# Patient Record
Sex: Female | Born: 1985 | Race: White | Hispanic: No | Marital: Single | Smoking: Never smoker
Health system: Southern US, Community
[De-identification: ages and names within clinical notes are randomized; demographics above are authoritative.]

---
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
## 2007-12-03 ENCOUNTER — Ambulatory Visit (HOSPITAL_COMMUNITY): Admission: RE | Admit: 2007-12-03 | Discharge: 2007-12-03 | Payer: Self-pay | Admitting: Internal Medicine

## 2009-10-06 ENCOUNTER — Encounter: Admission: RE | Admit: 2009-10-06 | Discharge: 2009-10-06 | Payer: Self-pay | Admitting: Obstetrics & Gynecology

## 2011-03-17 NOTE — Consult Note (Signed)
   NAME:  Misty Flores, [name]                       ACCOUNT NO.:  [id number]   MEDICAL RECORD NO.:  [id number]                   PATIENT TYPE:  EMS   LOCATION:  ED                                   FACILITY:  [facility]   PHYSICIAN:  Mila Homer. Sherlean Foot, M.D.              DATE OF BIRTH:  1986-10-26   DATE OF CONSULTATION:  10/10/2002  DATE OF DISCHARGE:                                   CONSULTATION   ADMITTING DIAGNOSES:  Right wrist pain.   HISTORY OF PRESENT ILLNESS:  The patient is an 25 year old white female who  was playing high school basketball game this evening, fell on outstretched  hand.  She was brought to the emergency department by her parents.  Chief  complaint was right wrist pain, no other complaints.  Otherwise, healthy.  No medications or allergies.   PHYSICAL EXAMINATION:  GENERAL:  She is well-developed, well-nourished, in  no distress.  VITAL SIGNS:  She is afebrile.  Vital signs are stable.  EXTREMITIES:  Right wrist has swelling at the radial carpal joint, but no  other gross deformity.  She is neurovascularly intact.  She has good pulses  and capillary refill.   LABORATORIES:  AP and lateral x-rays show an intra-articular displaced  distal radius fracture.   IMPRESSION:  Displaced right distal radius fracture, intra-articular.   TREATMENT:  I did a closed reduction with hematoma block and finger traps.  Closed reduction x-rays revealed good alignment and mild joint step off.  I  will see her back in the office to insure adequate reduction was maintained  and potentially refer her to a hand surgeon.  Given her narcotic pain  medicine and given the parents instructions on elevation and ice/elevation.                                               Mila Homer. Sherlean Foot, M.D.    SDL/MEDQ  D:  10/10/2002  T:  10/11/2002  Job:  [id number]

## 2014-09-01 ENCOUNTER — Encounter: Payer: Self-pay | Admitting: Family Medicine

## 2014-09-01 ENCOUNTER — Ambulatory Visit (INDEPENDENT_AMBULATORY_CARE_PROVIDER_SITE_OTHER): Payer: BC Managed Care – PPO | Admitting: Family Medicine

## 2014-09-01 ENCOUNTER — Other Ambulatory Visit (INDEPENDENT_AMBULATORY_CARE_PROVIDER_SITE_OTHER): Payer: BC Managed Care – PPO

## 2014-09-01 VITALS — BP 122/72 | HR 68 | Ht 68.0 in | Wt 145.0 lb

## 2014-09-01 DIAGNOSIS — M25562 Pain in left knee: Secondary | ICD-10-CM

## 2014-09-01 DIAGNOSIS — M7652 Patellar tendinitis, left knee: Secondary | ICD-10-CM

## 2014-09-01 MED ORDER — NITROGLYCERIN 0.2 MG/HR TD PT24: MEDICATED_PATCH | TRANSDERMAL | Status: DC

## 2014-09-01 NOTE — Progress Notes (Signed)
  Tawana ScaleZach Chaunda Vandergriff D.O. [facility] [address] [place], [place] [address] Phone: [phone] Subjective:     CC: left knee pain  BJY:NWGNFAOZHYHPI:Subjective Misty Flores is a 28 y.o. female coming in with complaint of left knee pain. Patient states that this seems to be more of a chronic problem has gotten worse recently. Patient states is more the anterior part of the knee. Worse with jumping a rotational component. Patient denies any radiation down the legs any numbness or weakness. States that it is starting to stop her from certain activities. Patient states it does respond to ibuprofen. Denies any nighttime awakening. Patient puts the severity of 8 out of 10.     Past medical history, social, surgical and family history all reviewed in electronic medical record.   Review of Systems: No headache, visual changes, nausea, vomiting, diarrhea, constipation, dizziness, abdominal pain, skin rash, fevers, chills, night sweats, weight loss, swollen lymph nodes, body aches, joint swelling, muscle aches, chest pain, shortness of breath, mood changes.   Objective Blood pressure 122/72, pulse 68, height 5\' 8"  (1.727 m), weight 145 lb (65.772 kg), SpO2 98 %.  General: No apparent distress alert and oriented x3 mood and affect normal, dressed appropriately.  HEENT: Pupils equal, extraocular movements intact  Respiratory: Patient's speak in full sentences and does not appear short of breath  Cardiovascular: No lower extremity edema, non tender, no erythema  Skin: Warm dry intact with no signs of infection or rash on extremities or on axial skeleton.  Abdomen: Soft nontender  Neuro: Cranial nerves II through XII are intact, neurovascularly intact in all extremities with 2+ DTRs and 2+ pulses.  Lymph: No lymphadenopathy of posterior or anterior cervical chain or axillae bilaterally.  Gait normal with good balance and coordination.  MSK:  Non tender with full range of motion and good stability and  symmetric strength and tone of shoulders, elbows, wrist, hip,and ankles bilaterally.  Knee:left Mild hypertrophy of the patellar tendon compared to the contralateral side.. Tender over the inferior aspect of the patella ROM full in flexion and extension and lower leg rotation. Ligaments with solid consistent endpoints including ACL, PCL, LCL, MCL. Negative Mcmurray's, Apley's, and Thessalonian tests. Non painful patellar compression. Patellar glide without crepitus. Patellar and quadriceps tendons unremarkable. Hamstring and quadriceps strength is normal.  Contralateral knee unremarkable  MSK US performed of: left knee This study was ordered, performed, and interpreted by Terrilee FilesZach Bryndan Bilyk D.O.  Knee: All structures visualized. Anteromedial, anterolateral, posteromedial, and posterolateral menisci unremarkable without tearing, fraying, effusion, or displacement. Patellar Tendon has partial tear at its insertion. Patient also has significant hypoechoic changes in this area as well as increasing Doppler flow. Bone spur noted No abnormality of prepatellar bursa. LCL and MCL unremarkable on long and transverse views. No abnormality of origin of medial or lateral head of the gastrocnemius.  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    Impression and Recommendations:     This case required medical decision making of moderate complexity.

## 2014-09-01 NOTE — Patient Instructions (Signed)
Very nice to meet you biggie Ice 20 minutes 2 times daily. Usually after activity and before bed. Exercises daily pennsiad twice daily if needed Nitroglycerin Protocol   Apply 1/4 nitroglycerin patch to affected area daily.  Change position of patch within the affected area every 24 hours.  You may experience a headache during the first 1-2 weeks of using the patch, these should subside.  If you experience headaches after beginning nitroglycerin patch treatment, you may take your preferred over the counter pain reliever.  Another side effect of the nitroglycerin patch is skin irritation or rash related to patch adhesive.  Please notify our office if you develop more severe headaches or rash, and stop the patch.  Tendon healing with nitroglycerin patch may require 12 to 24 weeks depending on the extent of injury.  Men should not use if taking Viagra, Cialis, or Levitra.   Do not use if you have migraines or rosacea.   No jumping or running for next 2 weeks.  Patellar strap and wear daily.  See me again in 3 weeks.

## 2014-12-25 ENCOUNTER — Ambulatory Visit (INDEPENDENT_AMBULATORY_CARE_PROVIDER_SITE_OTHER): Payer: BC Managed Care – PPO | Admitting: Family Medicine

## 2014-12-25 ENCOUNTER — Telehealth: Payer: Self-pay | Admitting: General Practice

## 2014-12-25 ENCOUNTER — Encounter: Payer: Self-pay | Admitting: Family Medicine

## 2014-12-25 VITALS — BP 120/74 | HR 68 | Ht 68.0 in | Wt 145.0 lb

## 2014-12-25 DIAGNOSIS — M7652 Patellar tendinitis, left knee: Secondary | ICD-10-CM

## 2014-12-25 MED ORDER — NITROGLYCERIN 0.2 MG/HR TD PT24: MEDICATED_PATCH | TRANSDERMAL | Status: DC

## 2014-12-25 NOTE — Telephone Encounter (Signed)
Pt was seen today. She needs to be "arrived"

## 2014-12-25 NOTE — Progress Notes (Signed)
Tawana ScaleZach Kyiesha Millward D.O. [facility] [address] [place], [place] [address] Phone: [phone] Subjective:     CC: left knee pain  BJY:NWGNFAOZHYHPI:Subjective Misty PernaJennifer S Flores is a 29 y.o. female coming in with complaint of left knee pain. Patient was diagnosed to acute on chronic patellar tendinitis. Patient was to try conservative measures and did respond well to nitroglycerin patches. Patient states as long she was on the nitroglycerin geyser patches she was doing significant better. Patient states since then the pain is come back which her stopping the medical history. Patient continues to be fairly active. Patient denies any worsening the pain just unfortunately continues to have the same dull throbbing aching pain. Can give her difficulties with activities of daily living such as going up or downstairs.     Past medical history, social, surgical and family history all reviewed in electronic medical record.   Review of Systems: No headache, visual changes, nausea, vomiting, diarrhea, constipation, dizziness, abdominal pain, skin rash, fevers, chills, night sweats, weight loss, swollen lymph nodes, body aches, joint swelling, muscle aches, chest pain, shortness of breath, mood changes.   Objective Blood pressure 120/74, pulse 68, height 5\' 8"  (1.727 m), weight 145 lb (65.772 kg), SpO2 99 %.  General: No apparent distress alert and oriented x3 mood and affect normal, dressed appropriately.  HEENT: Pupils equal, extraocular movements intact  Respiratory: Patient's speak in full sentences and does not appear short of breath  Cardiovascular: No lower extremity edema, non tender, no erythema  Skin: Warm dry intact with no signs of infection or rash on extremities or on axial skeleton.  Abdomen: Soft nontender  Neuro: Cranial nerves II through XII are intact, neurovascularly intact in all extremities with 2+ DTRs and 2+ pulses.  Lymph: No lymphadenopathy of posterior or anterior cervical chain or  axillae bilaterally.  Gait normal with good balance and coordination.  MSK:  Non tender with full range of motion and good stability and symmetric strength and tone of shoulders, elbows, wrist, hip,and ankles bilaterally.  Knee:left Mild hypertrophy of the patellar tendon compared to the contralateral side.. Tender over the inferior aspect of the patella still present ROM full in flexion and extension and lower leg rotation. Ligaments with solid consistent endpoints including ACL, PCL, LCL, MCL. Negative Mcmurray's, Apley's, and Thessalonian tests. Non painful patellar compression. Patellar glide without crepitus. Patellar and quadriceps tendons unremarkable. Hamstring and quadriceps strength is normal.  Contralateral knee unremarkable  MSK US performed of: left knee This study was ordered, performed, and interpreted by Terrilee FilesZach Cordell Guercio D.O.  Knee: All structures visualized. Anteromedial, anterolateral, posteromedial, and posterolateral menisci unremarkable without tearing, fraying, effusion, or displacement. Patellar Tendon has partial tear at its insertion with 0.5% better than previous. Significant decrease in hypoechoic changes. tellar bursa. LCL and MCL unremarkable on long and transverse views. No abnormality of origin of medial or lateral head of the gastrocnemius.  IMPRESSION: Continued patellar tendinitis with significant decrease in hypoechoic changes  Procedure: Real-time Ultrasound Guided Injection of the patellar tendon and bone spur on the inferior patella Device: GE Logiq E  Ultrasound guided injection is preferred based studies that show increased duration, increased effect, greater accuracy, decreased procedural pain, increased response rate, and decreased cost with ultrasound guided versus blind injection.  Verbal informed consent obtained.  Time-out conducted.  Noted no overlying erythema, induration, or other signs of local infection.  Skin prepped in a sterile fashion.   Local anesthesia: Topical Ethyl chloride.  With sterile technique and under  real time ultrasound guidance:  21-gauge 2 inch needle was used from a medial to lateral approach. Patient had an injection of 2 mL of 0.5% Marcaine and 0.5 mL of Kenalog 41 g/dL. Patient then had a partial amount of resistance at the bone spur and repetitive passes were made with the needle. Completed without difficulty  Pain immediately resolved suggesting accurate placement of the medication.  Advised to call if fevers/chills, erythema, induration, drainage, or persistent bleeding.  Images permanently stored and available for review in the ultrasound unit.  Impression: Technically successful ultrasound guided injection.    Impression and Recommendations:     This case required medical decision making of moderate complexity.

## 2014-12-25 NOTE — Progress Notes (Signed)
Pre visit review using our clinic review tool, if applicable. No additional management support is needed unless otherwise documented below in the visit note. 

## 2014-12-25 NOTE — Telephone Encounter (Signed)
Patient a no show. Ok to schedule again?  °

## 2014-12-25 NOTE — Patient Instructions (Signed)
Good to see you Ice in 6 hours then nightly No jumping, running, legs for 10 days Start the nitro again.  Then start exercises in handout in 10 days.  Ok to start legs in 10 days at 25% of weight of what you were doing.  Then in crease 10% a week.  Tell LT how you are doing in 10 days.

## 2014-12-25 NOTE — Assessment & Plan Note (Signed)
Discussed with patient at this time and patient understood that there was a risk and benefits to this procedure. Patient is going to decrease her activity over the course the next 10 days. We discussed icing regimen and home exercises. We discussed icing. Patient will restart the nitroglycerin and we'll do this on a longer amount of time. Patient and will come back and see me again in 3-4 weeks.

## 2017-05-30 ENCOUNTER — Encounter: Payer: Self-pay | Admitting: Family Medicine

## 2017-05-30 ENCOUNTER — Ambulatory Visit (INDEPENDENT_AMBULATORY_CARE_PROVIDER_SITE_OTHER): Payer: BC Managed Care – PPO | Admitting: Family Medicine

## 2017-05-30 ENCOUNTER — Ambulatory Visit: Payer: Self-pay

## 2017-05-30 VITALS — BP 118/68 | HR 70 | Ht 67.0 in

## 2017-05-30 DIAGNOSIS — M25512 Pain in left shoulder: Secondary | ICD-10-CM

## 2017-05-30 DIAGNOSIS — S42034A Nondisplaced fracture of lateral end of right clavicle, initial encounter for closed fracture: Secondary | ICD-10-CM | POA: Diagnosis not present

## 2017-05-30 DIAGNOSIS — M7652 Patellar tendinitis, left knee: Secondary | ICD-10-CM | POA: Diagnosis not present

## 2017-05-30 DIAGNOSIS — S42001A Fracture of unspecified part of right clavicle, initial encounter for closed fracture: Secondary | ICD-10-CM | POA: Insufficient documentation

## 2017-05-30 MED ORDER — VITAMIN D (ERGOCALCIFEROL) 1.25 MG (50000 UNIT) PO CAPS: 50000.0000 [IU] | ORAL_CAPSULE | ORAL | 0 refills | Status: DC

## 2017-05-30 MED ORDER — NITROGLYCERIN 0.2 MG/HR TD PT24: MEDICATED_PATCH | TRANSDERMAL | 1 refills | Status: AC

## 2017-05-30 NOTE — Assessment & Plan Note (Signed)
Patient does have what appears to be more of a clavicle fracture. Discussed with patient at great length. We discussed icing regimen and home exercises. We discussed which activities doing which ones to avoid. Patient will start to increase activity as tolerated. Patient will come back and see me again in 4-6 weeks. Worsening symptoms we'll consider further treatment options such as injection to stimulate possible healing.

## 2017-05-30 NOTE — Patient Instructions (Signed)
You b crazy per Uvaldo RisingLindsay  Ice 20 minutes 2 times daily. Usually after activity and before bed. Exercises 3 times a week.  Once weekly vitamin D for 12 weeks.  pennsaid pinkie amount topically 2 times daily as needed.   Nitroglycerin Protocol   Apply 1/4 nitroglycerin patch to affected area daily.  Change position of patch within the affected area every 24 hours.  You may experience a headache during the first 1-2 weeks of using the patch, these should subside.  If you experience headaches after beginning nitroglycerin patch treatment, you may take your preferred over the counter pain reliever.  Another side effect of the nitroglycerin patch is skin irritation or rash related to patch adhesive.  Please notify our office if you develop more severe headaches or rash, and stop the patch.  Tendon healing with nitroglycerin patch may require 12 to 24 weeks depending on the extent of injury.  Men should not use if taking Viagra, Cialis, or Levitra.   Do not use if you have migraines or rosacea.  Exercises 3 times a week.  See me again in 4-6 weeks Enjoy your nap!

## 2017-05-30 NOTE — Progress Notes (Signed)
Tawana ScaleZach Sahirah Rudell D.O. [facility] [address] [place], [place] [address] Phone: [phone] Subjective:    I'm seeing this patient by the request  of:    CC: Right shoulder pain, left knee pain  BJY:NWGNFAOZHYHPI:Subjective  Misty PernaJennifer S Adee is a 31 y.o. female coming in with complaint of right shoulder pain. Has been Neer's. Now hurting her more. Seems to be worse with overhead motions. 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 Finds pain seems to be more chronic recently. More anteriorly. No radiation down the arm. No numbness. Rates the severity of pain a 5 out of 10 it is worsening.  Patient is also having increasing pain of her left knee. Seems long time ago and did have more of a bursitis. Was given an injection. Patient states that this feels different. Still seems to be the anterior aspect of the knee. Denies any locking or giving out on her. States though that it is even sore with daily activities.      No past medical history on file. No past surgical history on file. Social History   Social History  . Marital status: Single    Spouse name: N/A  . Number of children: N/A  . Years of education: N/A   Social History Main Topics  . Smoking status: Never Smoker  . Smokeless tobacco: Never Used  . Alcohol use None  . Drug use: Unknown  . Sexual activity: Not Asked   Other Topics Concern  . None   Social History Narrative  . None   Not on File No family history on file.   Past medical history, social, surgical and family history all reviewed in electronic medical record.  No pertanent information unless stated regarding to the chief complaint.   Review of Systems:Review of systems updated and as accurate as of 05/30/17  No headache, visual changes, nausea, vomiting, diarrhea, constipation, dizziness, abdominal pain, skin rash, fevers, chills, night sweats, weight loss, swollen lymph nodes, body aches, joint swelling, muscle aches, chest pain, shortness  of breath, mood changes.   Objective  Blood pressure 118/68, pulse 70, height 5\' 7"  (1.702 m), SpO2 99 %. Systems examined below as of 05/30/17   General: No apparent distress alert and oriented x3 mood and affect normal, dressed appropriately.  HEENT: Pupils equal, extraocular movements intact  Respiratory: Patient's speak in full sentences and does not appear short of breath  Cardiovascular: No lower extremity edema, non tender, no erythema  Skin: Warm dry intact with no signs of infection or rash on extremities or on axial skeleton.  Abdomen: Soft nontender  Neuro: Cranial nerves II through XII are intact, neurovascularly intact in all extremities with 2+ DTRs and 2+ pulses.  Lymph: No lymphadenopathy of posterior or anterior cervical chain or axillae bilaterally.  Gait normal with good balance and coordination.  MSK:  Non tender with full range of motion and good stability and symmetric strength and tone of  elbows, wrist, hip, knee and ankles bilaterally.  Shoulder: Right Inspection reveals no abnormalities, atrophy or asymmetry. Mild tenderness over the distal clavicle ROM is full in all planes. Rotator cuff strength normal throughout. Mild impingement with positive crossover Speeds and Yergason's tests normal. No labral pathology noted with negative Obrien's, negative clunk and good stability. Normal scapular function observed. No painful arc and no drop arm sign. No apprehension sign Contralateral shoulder unremarkable  Knee: Left Normal to inspection with no erythema or effusion or obvious bony abnormalities. Pain over  the patellar tendon ROM full in flexion and extension and lower leg rotation. Ligaments with solid consistent endpoints including ACL, PCL, LCL, MCL. Negative Mcmurray's, Apley's, and Thessalonian tests. Non painful patellar compression. Patellar glide without crepitus. Patellar and quadriceps tendons unremarkable. Hamstring and quadriceps strength is  normal.  Contralateral knee unremarkable   MSK US performed of: Right shoulder This study was ordered, performed, and interpreted by Terrilee FilesZach Neilson Oehlert D.O.  Shoulder:   Supraspinatus:  Appears normal on long and transverse views, no bursal bulge seen with shoulder abduction on impingement view. Infraspinatus:  Appears normal on long and transverse views. Subscapularis:  Appears normal on long and transverse views. Teres Minor:  Appears normal on long and transverse views. AC joint:  Capsule undistended, no geyser sign. Patient is a clavicle show a nonhealing fracture noted. On the tensile sign. Glenohumeral Joint:  Appears normal without effusion. Glenoid Labrum:  Intact without visualized tears. Biceps Tendon:  Appears normal on long and transverse views, no fraying of tendon, tendon located in intertubercular groove, no subluxation with shoulder internal or external rotation. No increased power doppler signal. Impression: Nonhealing clavicle fracture   Impression and Recommendations:     This case required medical decision making of moderate complexity.      Note: This dictation was prepared with Dragon dictation along with smaller phrase technology. Any transcriptional errors that result from this process are unintentional.

## 2017-05-30 NOTE — Assessment & Plan Note (Signed)
Recurrent with worsening pain. Patient is having significant discomfort in the area. Restart the nitroglycerin and did seem to be helpful. Once weekly vitamin D for strength as well as help healing the distal clavicle fracture. Worsening symptoms consider an icing regimen. Could potentially worsening another injection. We will discuss at follow-up in 4-6 weeks

## 2017-07-23 ENCOUNTER — Telehealth: Payer: Self-pay | Admitting: Family Medicine

## 2017-07-23 ENCOUNTER — Encounter: Payer: Self-pay | Admitting: *Deleted

## 2017-07-23 NOTE — Telephone Encounter (Signed)
Pt called informing you that she left her phone at home today and if you schedule her an appointment to message her on MyChart.

## 2017-07-23 NOTE — Telephone Encounter (Signed)
Pt scheduled & made aware via mychart.

## 2017-07-26 ENCOUNTER — Ambulatory Visit (INDEPENDENT_AMBULATORY_CARE_PROVIDER_SITE_OTHER): Payer: BC Managed Care – PPO | Admitting: Family Medicine

## 2017-07-26 ENCOUNTER — Encounter: Payer: Self-pay | Admitting: Family Medicine

## 2017-07-26 DIAGNOSIS — M25511 Pain in right shoulder: Secondary | ICD-10-CM

## 2017-07-26 NOTE — Assessment & Plan Note (Signed)
10 injection today. Tolerated the procedure well. We discussed icing regimen and home exercises. We discussed which activities to do in which ones to avoid. Patient will start nitroglycerin patches which she's done in the past. Continue the once weekly vitamin D and discussed other over-the-counter medications a try to make this better. Patient does do a lot of overhead repetitive motions that likely contributed to some of the discomfort as well. Patient will try to increase activity as tolerated. Follow-up again in 4 weeks

## 2017-07-26 NOTE — Patient Instructions (Signed)
Good to see you  Watch the player Stop spiking on your players ;) Injected it today  Start 1/4 patch of nitro in the area Continue the vitamin D See me again In 4 weeks.

## 2017-07-26 NOTE — Progress Notes (Signed)
[facility] [address] [place], [place] [address] Phone: [phone] Subjective:     CC: Right shoulder pain  NUU:[id number]  Misty Flores is a 31 y.o. female coming in with complaint of right shoulder pain. Patient was found to have a distal clavicle fracture and AC pain. Patient states still having pain, still giving trouble at night and with certain movements. No radiation of pain  No weakness, but could hurt with reaching across the chest.    No past medical history on file. No past surgical history on file. Social History   Social History  . Marital status: Single    Spouse name: N/A  . Number of children: N/A  . Years of education: N/A   Social History Main Topics  . Smoking status: Never Smoker  . Smokeless tobacco: Never Used  . Alcohol use None  . Drug use: Unknown  . Sexual activity: Not Asked   Other Topics Concern  . None   Social History Narrative  . None   Not on File No family history on file.   Past medical history, social, surgical and family history all reviewed in electronic medical record.  No pertanent information unless stated regarding to the chief complaint.   Review of Systems:Review of systems updated and as accurate as of 07/26/17  No headache, visual changes, nausea, vomiting, diarrhea, constipation, dizziness, abdominal pain, skin rash, fevers, chills, night sweats, weight loss, swollen lymph nodes, body aches, joint swelling, chest pain, shortness of breath, mood changes. + muscle aches.   Objective  Blood pressure 100/70, pulse (!) 59, height  (1.702 m), weight 158 lb (71.7 kg), SpO2 99 %. Systems examined below as of 07/26/17   General: No apparent distress alert and oriented x3 mood and affect normal, dressed appropriately.  HEENT: Pupils equal, extraocular movements intact  Respiratory: Patient's speak in full sentences and does not appear short of breath  Cardiovascular: No lower  extremity edema, non tender, no erythema  Skin: Warm dry intact with no signs of infection or rash on extremities or on axial skeleton.  Abdomen: Soft nontender  Neuro: Cranial nerves II through XII are intact, neurovascularly intact in all extremities with 2+ DTRs and 2+ pulses.  Lymph: No lymphadenopathy of posterior or anterior cervical chain or axillae bilaterally.  Gait normal with good balance and coordination.  MSK:  Non tender with full range of motion and good stability and symmetric strength and tone of  elbows, wrist, hip, knee and ankles bilaterally.   Shoulder: Right Inspection reveals no abnormalities, atrophy or asymmetry. Pain over the acromial clavicular joint ROM is full in all planes. Rotator cuff strength normal throughout. Mild impingement Speeds and Yergason's tests normal. No labral pathology noted with negative Obrien's, negative clunk and good stability. Positive crossover Normal scapular function observed. No painful arc and no drop arm sign. No apprehension sign Contralateral shoulder unremarkable  After verbal consent patient was prepped with call swabs and with a 25-gauge half-inch needle was injected into the right acromioclavicular joint with 0.5 mL of 0.5% Marcaine and 0.5 mL of Kenalog 40 mg/dL no blood loss. Post injection instructions given.      Impression and Recommendations:     This case required medical decision making of moderate complexity.      Note: This dictation was prepared with Dragon dictation along with smaller phrase technology. Any transcriptional errors that result from this process are unintentional.

## 2017-10-02 ENCOUNTER — Other Ambulatory Visit: Payer: Self-pay | Admitting: *Deleted

## 2017-10-02 DIAGNOSIS — M25511 Pain in right shoulder: Secondary | ICD-10-CM

## 2017-10-31 ENCOUNTER — Ambulatory Visit
Admission: RE | Admit: 2017-10-31 | Discharge: 2017-10-31 | Disposition: A | Discharge status: Home / Self Care | Payer: BC Managed Care – PPO | Source: Ambulatory Visit | Attending: Family Medicine | Admitting: Family Medicine

## 2017-10-31 ENCOUNTER — Other Ambulatory Visit: Payer: Self-pay | Admitting: *Deleted

## 2017-10-31 DIAGNOSIS — M25511 Pain in right shoulder: Secondary | ICD-10-CM

## 2018-07-17 NOTE — Progress Notes (Signed)
[facility] Scale[facility] Smith D.[facility] [address] [place], [place] [address] Phone: [phone] Subjective:   [name], [name], am serving as a scribe for Dr. Antoine PrimasZachary Smith.    CC: Right shoulder pain  ZDG:[id number]:Subjective  Misty PernaJennifer S Flores is a 32 y.o. female coming in with complaint of right shoulder pain. Is in more pain now than before her surgery in April 2019. Pain is radiating down into the anterior deltoid. Constant pain. Has been having increasing pain with wiping down white board, hitting volleyballs. Ice and NSAIDs to alleviate her pain.     History reviewed. No pertinent past medical history. History reviewed. No pertinent surgical history. Social History   Socioeconomic History  . Marital status: Single    Spouse name: Not on file  . Number of children: Not on file  . Years of education: Not on file  . Highest education level: Not on file  Occupational History  . Not on file  Social Needs  . Financial resource strain: Not on file  . Food insecurity:    Worry: Not on file    Inability: Not on file  . Transportation needs:    Medical: Not on file    Non-medical: Not on file  Tobacco Use  . Smoking status: Never Smoker  . Smokeless tobacco: Never Used  Substance and Sexual Activity  . Alcohol use: Not on file  . Drug use: Not on file  . Sexual activity: Not on file  Lifestyle  . Physical activity:    Days per week: Not on file    Minutes per session: Not on file  . Stress: Not on file  Relationships  . Social connections:    Talks on phone: Not on file    Gets together: Not on file    Attends religious service: Not on file    Active member of club or organization: Not on file    Attends meetings of clubs or organizations: Not on file    Relationship status: Not on file  Other Topics Concern  . Not on file  Social History Narrative  . Not on file   Not on File History reviewed. No pertinent family history.   Current Outpatient Medications  (Cardiovascular):  .  nitroGLYCERIN (NITRODUR - DOSED IN MG/24 HR) 0.2 mg/hr patch, 1/4 patch daily     Current Outpatient Medications (Other):  Marland Kitchen.  Vitamin D, Ergocalciferol, (DRISDOL) 50000 units CAPS capsule, Take 1 capsule (50,000 Units total) by mouth every 7 (seven) days.   Past medical history, social, surgical and family history all reviewed in electronic medical record.  No pertanent information unless stated regarding to the chief complaint.   Review of Systems:  No headache, visual changes, nausea, vomiting, diarrhea, constipation, dizziness, abdominal pain, skin rash, fevers, chills, night sweats, weight loss, swollen lymph nodes, body aches, joint swelling, chest pain, shortness of breath, mood changes.  Positive muscle aches  Objective  Blood pressure 100/70, pulse 64, height 5\' 7"  (1.702 m), weight 157 lb (71.2 kg), SpO2 96 %.   General: No apparent distress alert and oriented x3 mood and affect normal, dressed appropriately.  HEENT: Pupils equal, extraocular movements intact  Respiratory: Patient's speak in full sentences and does not appear short of breath  Cardiovascular: No lower extremity edema, non tender, no erythema  Skin: Warm dry intact with no signs of infection or rash on extremities or on axial skeleton.  Abdomen: Soft nontender  Neuro: Cranial nerves II through XII are intact,  neurovascularly intact in all extremities with 2+ DTRs and 2+ pulses.  Lymph: No lymphadenopathy of posterior or anterior cervical chain or axillae bilaterally.  Gait normal with good balance and coordination.  MSK:  Non tender with full range of motion and good stability and symmetric strength and tone of  elbows, wrist, hip, knee and ankles bilaterally.  Shoulder: Right Inspection reveals does have hypertrophy of the acromioclavicular joint noted.. Palpation is normal with no tenderness over AC joint or bicipital groove. ROM is decreased noted.  Forward flexion of 140 degrees, mild  limited in internal range of motion to sacrum. Rotator cuff strength normal throughout. Impingement noted Speeds and Yergason's tests normal. No labral pathology noted with negative Obrien's, negative clunk and good stability. Normal scapular function observed. No painful arc and no drop arm sign. No apprehension sign Contralateral shoulder unremarkable  Procedure: Real-time Ultrasound Guided Injection of right glenohumeral joint Device: GE Logiq Q7  Ultrasound guided injection is preferred based studies that show increased duration, increased effect, greater accuracy, decreased procedural pain, increased response rate with ultrasound guided versus blind injection.  Verbal informed consent obtained.  Time-out conducted.  Noted no overlying erythema, induration, or other signs of local infection.  Skin prepped in a sterile fashion.  Local anesthesia: Topical Ethyl chloride.  With sterile technique and under real time ultrasound guidance:  Joint visualized.  23g 1  inch needle inserted posterior approach. Pictures taken for needle placement. Patient did have injection of 2 cc of 1% lidocaine, 2 cc of 0.5% Marcaine, and 1.0 cc of Kenalog 40 mg/dL. Completed without difficulty  Pain immediately resolved suggesting accurate placement of the medication.  Advised to call if fevers/chills, erythema, induration, drainage, or persistent bleeding.  Images permanently stored and available for review in the ultrasound unit.  Impression: Technically successful ultrasound guided injection.     Impression and Recommendations:     This case required medical decision making of moderate complexity. The above documentation has been reviewed and is accurate and complete Judi Saa, DO       Note: This dictation was prepared with Dragon dictation along with smaller phrase technology. Any transcriptional errors that result from this process are unintentional.

## 2018-07-18 ENCOUNTER — Encounter: Payer: Self-pay | Admitting: Family Medicine

## 2018-07-18 ENCOUNTER — Ambulatory Visit: Payer: BC Managed Care – PPO | Admitting: Family Medicine

## 2018-07-18 ENCOUNTER — Ambulatory Visit: Payer: Self-pay

## 2018-07-18 VITALS — BP 100/70 | HR 64 | Ht 67.0 in | Wt 157.0 lb

## 2018-07-18 DIAGNOSIS — G8929 Other chronic pain: Secondary | ICD-10-CM | POA: Diagnosis not present

## 2018-07-18 DIAGNOSIS — M7501 Adhesive capsulitis of right shoulder: Secondary | ICD-10-CM | POA: Diagnosis not present

## 2018-07-18 DIAGNOSIS — M7500 Adhesive capsulitis of unspecified shoulder: Secondary | ICD-10-CM | POA: Insufficient documentation

## 2018-07-18 DIAGNOSIS — M25511 Pain in right shoulder: Secondary | ICD-10-CM

## 2018-07-18 NOTE — Assessment & Plan Note (Signed)
New problem.  Patient did have surgical intervention for the impingement with the acromial clavicular.  Does have some hypertrophy but does not seem to be causing the difficulty.  Given an injection today and had some improvement after some adhesions did seem to pop.  Discussed icing regimen and home exercises.  Discussed which activities to do which wants to avoid.  Discussed topical anti-inflammatories and vitamin D supplementation.  Following up again in 4 to 6 weeks

## 2019-08-26 ENCOUNTER — Other Ambulatory Visit: Payer: Self-pay

## 2019-08-26 DIAGNOSIS — Z20822 Contact with and (suspected) exposure to covid-19: Secondary | ICD-10-CM

## 2019-08-28 LAB — NOVEL CORONAVIRUS, NAA: SARS-CoV-2, NAA: NOT DETECTED

## 2019-10-27 ENCOUNTER — Ambulatory Visit: Payer: BC Managed Care – PPO | Attending: Internal Medicine

## 2019-10-27 DIAGNOSIS — Z20822 Contact with and (suspected) exposure to covid-19: Secondary | ICD-10-CM

## 2019-10-29 LAB — NOVEL CORONAVIRUS, NAA: SARS-CoV-2, NAA: DETECTED — AB

## 2020-01-08 ENCOUNTER — Ambulatory Visit: Payer: BC Managed Care – PPO | Attending: Internal Medicine

## 2020-01-08 DIAGNOSIS — Z23 Encounter for immunization: Secondary | ICD-10-CM

## 2020-01-08 NOTE — Progress Notes (Signed)
   Covid-19 Vaccination Clinic  Name:  Misty Flores    MRN: [id number] DOB: 05-16-1986  01/08/2020  Misty Flores was observed post Covid-19 immunization for 15 minutes without incident. She was provided with Vaccine Information Sheet and instruction to access the V-Safe system.   Misty Flores was instructed to call 911 with any severe reactions post vaccine: Marland Kitchen Difficulty breathing  . Swelling of face and throat  . A fast heartbeat  . A bad rash all over body  . Dizziness and weakness   Immunizations Administered    Name Date Dose VIS Date Route   Pfizer COVID-19 Vaccine 01/08/2020  4:02 PM 0.3 mL 10/10/2019 Intramuscular   Manufacturer: [employer], [employer]   Lot: [id number]   NDC: [address]-4

## 2020-02-03 ENCOUNTER — Ambulatory Visit: Payer: BC Managed Care – PPO | Attending: Internal Medicine

## 2020-02-03 DIAGNOSIS — Z23 Encounter for immunization: Secondary | ICD-10-CM

## 2020-02-03 NOTE — Progress Notes (Signed)
   Covid-19 Vaccination Clinic  Name:  LARENA OHNEMUS    MRN: [id number] DOB: Dec 08, 1985  02/03/2020  Ms. Delaluz was observed post Covid-19 immunization for 15 minutes without incident. She was provided with Vaccine Information Sheet and instruction to access the V-Safe system.   Ms. Kadar was instructed to call 911 with any severe reactions post vaccine: Marland Kitchen Difficulty breathing  . Swelling of face and throat  . A fast heartbeat  . A bad rash all over body  . Dizziness and weakness   Immunizations Administered    Name Date Dose VIS Date Route   Pfizer COVID-19 Vaccine 02/03/2020  3:56 PM 0.3 mL 10/10/2019 Intramuscular   Manufacturer: [employer], [employer]   Lot: [id number]   NDC: [address]-5

## 2021-06-23 ENCOUNTER — Ambulatory Visit (INDEPENDENT_AMBULATORY_CARE_PROVIDER_SITE_OTHER): Payer: BC Managed Care – PPO | Admitting: Otolaryngology

## 2022-01-17 ENCOUNTER — Ambulatory Visit: Payer: BC Managed Care – PPO | Admitting: Family Medicine

## 2022-01-17 ENCOUNTER — Encounter: Payer: Self-pay | Admitting: Family Medicine

## 2022-01-17 ENCOUNTER — Other Ambulatory Visit: Payer: Self-pay

## 2022-01-17 ENCOUNTER — Ambulatory Visit: Payer: Self-pay

## 2022-01-17 ENCOUNTER — Ambulatory Visit (INDEPENDENT_AMBULATORY_CARE_PROVIDER_SITE_OTHER): Payer: BC Managed Care – PPO

## 2022-01-17 ENCOUNTER — Other Ambulatory Visit: Payer: Self-pay | Admitting: Family Medicine

## 2022-01-17 VITALS — BP 116/84 | HR 77 | Ht 67.0 in | Wt 148.0 lb

## 2022-01-17 DIAGNOSIS — M25562 Pain in left knee: Secondary | ICD-10-CM | POA: Diagnosis not present

## 2022-01-17 DIAGNOSIS — M25462 Effusion, left knee: Secondary | ICD-10-CM | POA: Diagnosis not present

## 2022-01-17 MED ORDER — MELOXICAM 15 MG PO TABS: 15.0000 mg | ORAL_TABLET | Freq: Every day | ORAL | 0 refills | Status: DC

## 2022-01-17 NOTE — Progress Notes (Signed)
?Terrilee Files D.O. ?[facility] ?[address] [place] [address] ?Phone: [phone] ?Subjective:   ?I, [name], am serving as a scribe for Dr. Antoine Primas. ? ?This visit occurred during the SARS-CoV-2 public health emergency.  Safety protocols were in place, including screening questions prior to the visit, additional usage of staff PPE, and extensive cleaning of exam room while observing appropriate contact time as indicated for disinfecting solutions.  ? ?I'm seeing this patient by the request  of:  Patient, No Pcp Per (Inactive) ? ?CC: Left knee pain ? ?[id number]  ?Misty Flores is a 36 y.o. female coming in with complaint of left knee pain for past week. Seen for patellar tendonitis in 2018. Patient states that she has had pain over lateral aspect of knee. Pain worse in flexion. Pain is constant. Patient has been spinning a lot since Christmas.  ?  ? ?No past medical history on file. ?No past surgical history on file. ?Social History  ? ?Socioeconomic History  ? Marital status: Single  ?  Spouse name: Not on file  ? Number of children: Not on file  ? Years of education: Not on file  ? Highest education level: Not on file  ?Occupational History  ? Not on file  ?Tobacco Use  ? Smoking status: Never  ? Smokeless tobacco: Never  ?Substance and Sexual Activity  ? Alcohol use: Not on file  ? Drug use: Not on file  ? Sexual activity: Not on file  ?Other Topics Concern  ? Not on file  ?Social History Narrative  ? Not on file  ? ?Social Determinants of Health  ? ?Financial Resource Strain: Not on file  ?Food Insecurity: Not on file  ?Transportation Needs: Not on file  ?Physical Activity: Not on file  ?Stress: Not on file  ?Social Connections: Not on file  ? ?Not on File ?No family history on file. ? ? ?Current Outpatient Medications (Cardiovascular):  ?  nitroGLYCERIN (NITRODUR - DOSED IN MG/24 HR) 0.2 mg/hr patch, 1/4 patch daily ? ? ?Current Outpatient Medications (Analgesics):  ?   meloxicam (MOBIC) 15 MG tablet, Take 1 tablet (15 mg total) by mouth daily. ? ? ?Current Outpatient Medications (Other):  ?  Vitamin D, Ergocalciferol, (DRISDOL) 50000 units CAPS capsule, Take 1 capsule (50,000 Units total) by mouth every 7 (seven) days. ? ? ?Reviewed prior external information including notes and imaging from  ?primary care provider ?As well as notes that were available from care everywhere and other healthcare systems. ? ?Past medical history, social, surgical and family history all reviewed in electronic medical record.  No pertanent information unless stated regarding to the chief complaint.  ? ?Review of Systems: ? No headache, visual changes, nausea, vomiting, diarrhea, constipation, dizziness, abdominal pain, skin rash, fevers, chills, night sweats, weight loss, swollen lymph nodes, body aches, joint swelling, chest pain, shortness of breath, mood changes. POSITIVE muscle aches ? ?Objective  ?Blood pressure 116/84, pulse 77, height 5\' 7"  (1.702 m), weight 148 lb (67.1 kg), last menstrual period 12/26/2021, SpO2 99 %. ?  ?General: No apparent distress alert and oriented x3 mood and affect normal, dressed appropriately.  ?HEENT: Pupils equal, extraocular movements intact  ?Respiratory: Patient's speak in full sentences and does not appear short of breath  ?Cardiovascular: No lower extremity edema, non tender, no erythema  ?Gait normal with good balance and coordination.  ?MSK:   ?Left knee exam shows patient does have a trace effusion noted.  Lacks the last 5  degrees of flexion.  Patient does have mild instability of the patella noted.  Patient is tender to palpation more over the lateral joint line and the medial ? ?Limited muscular skeletal ultrasound was performed and interpreted by Antoine Primas, M  ?Limited ultrasound of patient's knee does have an effusion noted.  Patient does also have what appears to be a degenerative meniscus tear noted that is minorly protruding. ? ?Impression: Knee  effusion with chronic Meniscal injury ? ?  ?Impression and Recommendations:  ?  ? ?The above documentation has been reviewed and is accurate and complete Judi Saa, DO ? ? ? ?

## 2022-01-17 NOTE — Patient Instructions (Signed)
Tru-pull lite on [employer] ?Meloxicam 15mg  daily for 10 days ?Change seat on bike  ?Xray L knee on way out ?See me again in 5-6 weeks ?

## 2022-01-17 NOTE — Assessment & Plan Note (Signed)
Patient does have an effusion of the left knee.  Does have some mild narrowing of the lateral compartment.  Patient also does have a little more of a degenerative lateral meniscal tear noted.  Patient may also have unfortunately lateral tracking secondary to the bite.  We discussed meloxicam, icing regimen, which activities to do which wants to avoid.  Shoe pro lite brace given as well.  Follow-up again in 4 to 6 weeks worsening pain consider formal physical therapy or injection/aspiration ?

## 2022-02-21 NOTE — Progress Notes (Signed)
?Misty Flores D.O. ?[facility] ?[address] [place] [address] ?Phone: [phone] ?Subjective:   ?I, Misty Flores, am serving as a scribe for Dr. Antoine Flores. ? ?This visit occurred during the SARS-CoV-2 public health emergency.  Safety protocols were in place, including screening questions prior to the visit, additional usage of staff PPE, and extensive cleaning of exam room while observing appropriate contact time as indicated for disinfecting solutions.  ? ? ?I'm seeing this patient by the request  of:  Patient, No Pcp Per (Inactive) ? ?CC: left knee pain  ? ?[id number]  ?01/17/2022 ?Patient does have an effusion of the left knee.  Does have some mild narrowing of the lateral compartment.  Patient also does have a little more of a degenerative lateral meniscal tear noted.  Patient may also have unfortunately lateral tracking secondary to the bite.  We discussed meloxicam, icing regimen, which activities to do which wants to avoid.  Shoe pro lite brace given as well.  Follow-up again in 4 to 6 weeks worsening pain consider formal physical therapy or injection/aspiration ? ?Updated 02/22/2022 ?Misty Flores is a 36 y.o. female coming in with complaint of L knee pain. Patient states that with every step she is feeling pain in lateral aspect. Has popping, clicking and grinding. Patient denies having any weakness. ? ? ? ?  ? ?No past medical history on file. ?No past surgical history on file. ?Social History  ? ?Socioeconomic History  ? Marital status: Single  ?  Spouse name: Not on file  ? Number of children: Not on file  ? Years of education: Not on file  ? Highest education level: Not on file  ?Occupational History  ? Not on file  ?Tobacco Use  ? Smoking status: Never  ? Smokeless tobacco: Never  ?Substance and Sexual Activity  ? Alcohol use: Not on file  ? Drug use: Not on file  ? Sexual activity: Not on file  ?Other Topics Concern  ? Not on file  ?Social History Narrative  ? Not on  file  ? ?Social Determinants of Health  ? ?Financial Resource Strain: Not on file  ?Food Insecurity: Not on file  ?Transportation Needs: Not on file  ?Physical Activity: Not on file  ?Stress: Not on file  ?Social Connections: Not on file  ? ?Not on File ?No family history on file. ? ? ?Current Outpatient Medications (Cardiovascular):  ?  nitroGLYCERIN (NITRODUR - DOSED IN MG/24 HR) 0.2 mg/hr patch, 1/4 patch daily ? ? ?Current Outpatient Medications (Analgesics):  ?  meloxicam (MOBIC) 15 MG tablet, TAKE 1 TABLET(15 MG) BY MOUTH DAILY ? ? ?Current Outpatient Medications (Other):  ?  Vitamin D, Ergocalciferol, (DRISDOL) 50000 units CAPS capsule, Take 1 capsule (50,000 Units total) by mouth every 7 (seven) days. ? ? ?Reviewed prior external information including notes and imaging from  ?primary care provider ?As well as notes that were available from care everywhere and other healthcare systems. ? ?Past medical history, social, surgical and family history all reviewed in electronic medical record.  No pertanent information unless stated regarding to the chief complaint.  ? ?Review of Systems: ? No headache, visual changes, nausea, vomiting, diarrhea, constipation, dizziness, abdominal pain, skin rash, fevers, chills, night sweats, weight loss, swollen lymph nodes, body aches, joint swelling, chest pain, shortness of breath, mood changes. POSITIVE muscle aches ? ?Objective  ?Blood pressure 102/76, pulse 76, height 5\' 7"  (1.702 m), weight 168 lb (76.2 kg), SpO2 99 %. ?  ?  General: No apparent distress alert and oriented x3 mood and affect normal, dressed appropriately.  ?HEENT: Pupils equal, extraocular movements intact  ?Respiratory: Patient's speak in full sentences and does not appear short of breath  ?Cardiovascular: No lower extremity edema, non tender, no erythema  ?Gait normal with good balance and coordination.  ?MSK:  left knee is effusion noted, TTP medial and lateral instability with thessaly  ? ?Limited  muscular skeletal ultrasound was performed and interpreted by Misty Flores, M  ?Shows increase in hypoechoic changes noted. More in the PFJ large effusion.  ? Effusion noted on the medial aspect aspect around the medial meniscus.  Patient does have some displacement noted.  Possible parameniscal cyst also noted. ? ? ? ?  ?Impression and Recommendations:  ?  ? ?The above documentation has been reviewed and is accurate and complete Misty Saa, DO ? ? ? ?

## 2022-02-22 ENCOUNTER — Ambulatory Visit: Payer: Self-pay

## 2022-02-22 ENCOUNTER — Ambulatory Visit (INDEPENDENT_AMBULATORY_CARE_PROVIDER_SITE_OTHER): Payer: BC Managed Care – PPO | Admitting: Family Medicine

## 2022-02-22 VITALS — BP 102/76 | HR 76 | Ht 67.0 in | Wt 168.0 lb

## 2022-02-22 DIAGNOSIS — M25562 Pain in left knee: Secondary | ICD-10-CM | POA: Diagnosis not present

## 2022-02-22 NOTE — Patient Instructions (Signed)
Misty Flores it! ?Lets get MRI and see what is making it worse.  ?Call [phone] to schedule.  ?I will write you and decide if I can drain it or what else we would need to do.  ?Lillia Abed is a bad influence ?

## 2022-02-25 ENCOUNTER — Ambulatory Visit
Admission: RE | Admit: 2022-02-25 | Discharge: 2022-02-25 | Disposition: A | Discharge status: Home / Self Care | Payer: BC Managed Care – PPO | Source: Ambulatory Visit | Attending: Family Medicine | Admitting: Family Medicine

## 2022-02-25 DIAGNOSIS — M25562 Pain in left knee: Secondary | ICD-10-CM

## 2022-02-28 ENCOUNTER — Other Ambulatory Visit: Payer: Self-pay

## 2022-02-28 DIAGNOSIS — M25462 Effusion, left knee: Secondary | ICD-10-CM

## 2022-02-28 DIAGNOSIS — M25562 Pain in left knee: Secondary | ICD-10-CM

## 2022-03-04 ENCOUNTER — Other Ambulatory Visit: Payer: BC Managed Care – PPO

## 2023-02-05 ENCOUNTER — Other Ambulatory Visit: Payer: Self-pay | Admitting: Family Medicine

## 2023-02-22 ENCOUNTER — Ambulatory Visit: Payer: BC Managed Care – PPO | Admitting: Family Medicine

## 2023-02-22 ENCOUNTER — Ambulatory Visit (INDEPENDENT_AMBULATORY_CARE_PROVIDER_SITE_OTHER): Payer: Worker's Compensation

## 2023-02-22 ENCOUNTER — Encounter: Payer: Self-pay | Admitting: Family Medicine

## 2023-02-22 ENCOUNTER — Other Ambulatory Visit: Payer: Self-pay

## 2023-02-22 VITALS — BP 118/84 | HR 75 | Ht 67.0 in | Wt 159.0 lb

## 2023-02-22 DIAGNOSIS — M25531 Pain in right wrist: Secondary | ICD-10-CM

## 2023-02-22 DIAGNOSIS — S62024A Nondisplaced fracture of middle third of navicular [scaphoid] bone of right wrist, initial encounter for closed fracture: Secondary | ICD-10-CM

## 2023-02-22 DIAGNOSIS — S62009A Unspecified fracture of navicular [scaphoid] bone of unspecified wrist, initial encounter for closed fracture: Secondary | ICD-10-CM | POA: Insufficient documentation

## 2023-02-22 MED ORDER — VITAMIN D (ERGOCALCIFEROL) 1.25 MG (50000 UNIT) PO CAPS: 50000.0000 [IU] | ORAL_CAPSULE | ORAL | 0 refills | Status: AC

## 2023-02-22 NOTE — Progress Notes (Signed)
[facility] [address] [place] [address] Phone: [phone] Subjective:    I'm seeing this patient by the request  of:  Patient, No Pcp Per  CC: Right wrist pain  [id number]  Misty Flores is a 37 y.o. female coming in with complaint of R wrist pain. Patient states that she was playing volleyball 2 weeks ago and has had pain over [facility] joint and wrist. Ball hit wrist . Broke radiu in high school. Pain can be sharp at times but dull most of the time.       No past medical history on file. No past surgical history on file. Social History   Socioeconomic History   Marital status: Single    Spouse name: Not on file   Number of children: Not on file   Years of education: Not on file   Highest education level: Not on file  Occupational History   Not on file  Tobacco Use   Smoking status: Never   Smokeless tobacco: Never  Substance and Sexual Activity   Alcohol use: Not on file   Drug use: Not on file   Sexual activity: Not on file  Other Topics Concern   Not on file  Social History Narrative   Not on file   Social Determinants of Health   Financial Resource Strain: Not on file  Food Insecurity: Not on file  Transportation Needs: Not on file  Physical Activity: Not on file  Stress: Not on file  Social Connections: Not on file   Not on File No family history on file.   Current Outpatient Medications (Cardiovascular):    nitroGLYCERIN (NITRODUR - DOSED IN MG/24 HR) 0.2 mg/hr patch, 1/4 patch daily   Current Outpatient Medications (Analgesics):    meloxicam (MOBIC) 15 MG tablet, TAKE 1 TABLET(15 MG) BY MOUTH DAILY   Current Outpatient Medications (Other):    Vitamin D, Ergocalciferol, (DRISDOL) 1.25 MG (50000 UNIT) CAPS capsule, Take 1 capsule (50,000 Units total) by mouth every 7 (seven) days.   Reviewed prior external information including notes and imaging from  primary care provider As well as notes that were  available from care everywhere and other healthcare systems.  Past medical history, social, surgical and family history all reviewed in electronic medical record.  No pertanent information unless stated regarding to the chief complaint.   Review of Systems:  No headache, visual changes, nausea, vomiting, diarrhea, constipation, dizziness, abdominal pain, skin rash, fevers, chills, night sweats, weight loss, swollen lymph nodes, body aches, joint swelling, chest pain, shortness of breath, mood changes. POSITIVE muscle aches  Objective  Blood pressure 118/84, pulse 75, height  (1.702 m), weight 159 lb (72.1 kg), SpO2 99 %.   General: No apparent distress alert and oriented x3 mood and affect normal, dressed appropriately.  HEENT: Pupils equal, extraocular movements intact  Respiratory: Patient's speak in full sentences and does not appear short of breath  Cardiovascular: No lower extremity edema, non tender, no erythema  Right wrist exam severe pain with ulnar and radial deviation.  Worsening pain with palpation on the radial aspect.  Severe tenderness in the anatomical snuffbox.  Limited muscular skeletal ultrasound was performed and interpreted by Antoine Primas, M  Limited ultrasound shows the patient does have a cortical irregularity noted of the mid substance of the scaphoid bone with increasing neovascularization in the area.  Patient also has what appears to be a possible very small irregularity of the distal radius. Impression:  Scaphoid fracture    Impression and Recommendations:    The above documentation has been reviewed and is accurate and complete Judi Saa, DO

## 2023-02-22 NOTE — Patient Instructions (Addendum)
Scaphoid fx Xray today Thumb spica splint Once weekly Vit D2 K2 for a month See me 3 weeks

## 2023-02-22 NOTE — Assessment & Plan Note (Signed)
Patient does have a fracture noted.  Seems to be more of the mid portion of the scaphoid bone.  Does have some mild healing noted on ultrasound as well as on x-ray but still very minimal.  Questionable styloid irregularity also noted on x-ray.  Patient put in an EXOS brace and hopefully that this will be beneficial.  Will follow-up again in 3 weeks

## 2023-03-14 NOTE — Progress Notes (Unsigned)
[facility] [address] [place] [address] Phone: [phone] Subjective:   Misty Flores, am serving as a scribe for Dr. Antoine Primas.  I'm seeing this patient by the request  of:  Patient, No Pcp Per  CC: Right wrist pain follow-up  [id number]  02/22/2023 Patient does have a fracture noted.  Seems to be more of the mid portion of the scaphoid bone.  Does have some mild healing noted on ultrasound as well as on x-ray but still very minimal.  Questionable styloid irregularity also noted on x-ray.  Patient put in an EXOS brace and hopefully that this will be beneficial.  Will follow-up again in 3 weeks   Updated 03/15/2023 Misty Flores is a 37 y.o. female coming in with complaint of R wrist pain. Patient states that her pain is the same as last visit.  Patient states that has not noted any difference from being in the brace.  Continues to have a significant amount of pain.    No past medical history on file. No past surgical history on file. Social History   Socioeconomic History   Marital status: Single    Spouse name: Not on file   Number of children: Not on file   Years of education: Not on file   Highest education level: Not on file  Occupational History   Not on file  Tobacco Use   Smoking status: Never   Smokeless tobacco: Never  Substance and Sexual Activity   Alcohol use: Not on file   Drug use: Not on file   Sexual activity: Not on file  Other Topics Concern   Not on file  Social History Narrative   Not on file   Social Determinants of Health   Financial Resource Strain: Not on file  Food Insecurity: Not on file  Transportation Needs: Not on file  Physical Activity: Not on file  Stress: Not on file  Social Connections: Not on file   Not on File No family history on file.   Current Outpatient Medications (Cardiovascular):    nitroGLYCERIN (NITRODUR - DOSED IN MG/24 HR) 0.2 mg/hr patch, 1/4 patch  daily   Current Outpatient Medications (Analgesics):    meloxicam (MOBIC) 15 MG tablet, TAKE 1 TABLET(15 MG) BY MOUTH DAILY   Current Outpatient Medications (Other):    Vitamin D, Ergocalciferol, (DRISDOL) 1.25 MG (50000 UNIT) CAPS capsule, Take 1 capsule (50,000 Units total) by mouth every 7 (seven) days.   Reviewed prior external information including notes and imaging from  primary care provider As well as notes that were available from care everywhere and other healthcare systems.  Past medical history, social, surgical and family history all reviewed in electronic medical record.  No pertanent information unless stated regarding to the chief complaint.   Review of Systems:  No headache, visual changes, nausea, vomiting, diarrhea, constipation, dizziness, abdominal pain, skin rash, fevers, chills, night sweats, weight loss, swollen lymph nodes, body aches, joint swelling, chest pain, shortness of breath, mood changes. POSITIVE muscle aches  Objective  Blood pressure 118/82, pulse 82, height 5\' 7"  (1.702 m), weight 166 lb (75.3 kg), last menstrual period 02/11/2023, SpO2 98 %.   General: No apparent distress alert and oriented x3 mood and affect normal, dressed appropriately.  HEENT: Pupils equal, extraocular movements intact  Respiratory: Patient's speak in full sentences and does not appear short of breath  Cardiovascular: No lower extremity edema, non tender, no erythema  Right wrist exam does  have some tightness noted. Removing the brace patient is still severely tender over the anatomical snuffbox.  The patient also tender on the dorsal aspect of the wrist and the distal aspect of the radius.  Good grip strength noted.  Neurovascularly intact distally.  Limited muscular skeletal ultrasound was performed and interpreted by Antoine Primas, M  Limited ultrasound shows the patient does have a cortical irregularity still noted of the proximal scaphoid.  No significant callus  formation.  Increasing in Doppler flow is still noted.  Patient has maybe some mild decrease in the hypoechoic changes.  Comparing the contralateral side does seem to have irregularity. Impression: Likely injury to the scalp.  Nonhealing    Impression and Recommendations:

## 2023-03-15 ENCOUNTER — Encounter: Payer: Self-pay | Admitting: Family Medicine

## 2023-03-15 ENCOUNTER — Other Ambulatory Visit: Payer: Self-pay

## 2023-03-15 ENCOUNTER — Ambulatory Visit: Payer: BC Managed Care – PPO | Admitting: Family Medicine

## 2023-03-15 VITALS — BP 118/82 | HR 82 | Ht 67.0 in | Wt 166.0 lb

## 2023-03-15 DIAGNOSIS — S62001G Unspecified fracture of navicular [scaphoid] bone of right wrist, subsequent encounter for fracture with delayed healing: Secondary | ICD-10-CM

## 2023-03-15 DIAGNOSIS — M25531 Pain in right wrist: Secondary | ICD-10-CM

## 2023-03-15 NOTE — Assessment & Plan Note (Signed)
Patient does still have a cortical irregularity noted of the proximal scaphoid.  This is on ultrasound.  Consistent with some of the abnormality noted even on the x-ray.  Patient has not done extremely well with the immobility.  Does not seem to have significant swelling and limited potentially worsening.  No concern for avascular necrosis at this point.  Would like to get MRI for further evaluation at this time.  See if anything else is contributing to her discomfort and pain.  Depending on findings we will discuss medical or surgical management.

## 2023-03-15 NOTE — Patient Instructions (Signed)
MRI R wrist  We will be in touch

## 2023-03-18 ENCOUNTER — Ambulatory Visit (INDEPENDENT_AMBULATORY_CARE_PROVIDER_SITE_OTHER): Payer: Worker's Compensation

## 2023-03-18 DIAGNOSIS — M25531 Pain in right wrist: Secondary | ICD-10-CM

## 2023-03-18 DIAGNOSIS — S62001G Unspecified fracture of navicular [scaphoid] bone of right wrist, subsequent encounter for fracture with delayed healing: Secondary | ICD-10-CM

## 2023-03-21 ENCOUNTER — Encounter: Payer: Self-pay | Admitting: Family Medicine

## 2023-03-27 ENCOUNTER — Encounter: Payer: Self-pay | Admitting: Family Medicine

## 2023-03-27 ENCOUNTER — Ambulatory Visit: Payer: BC Managed Care – PPO | Admitting: Family Medicine

## 2023-03-27 ENCOUNTER — Other Ambulatory Visit: Payer: Self-pay

## 2023-03-27 VITALS — BP 110/82 | HR 69 | Ht 67.0 in | Wt 165.0 lb

## 2023-03-27 DIAGNOSIS — M25531 Pain in right wrist: Secondary | ICD-10-CM

## 2023-03-27 DIAGNOSIS — M19031 Primary osteoarthritis, right wrist: Secondary | ICD-10-CM

## 2023-03-27 NOTE — Assessment & Plan Note (Signed)
Patient given injection and tolerated the procedure well.  Does have more of the distal radiocarpal swelling.  Hopeful that this will make a difference.  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  Hopeful that this will make an improvement in the area as well.  Discussed which activities to do and which ones to avoid.  Increase activity slowly otherwise.  Will follow-up again in 6 to 8 weeks.

## 2023-03-27 NOTE — Progress Notes (Signed)
[facility] [address] [place] [address] Phone: [phone] Subjective:   [name], am serving as a scribe for Dr. Antoine Primas.  I'm seeing this patient by the request  of:  Patient, No Pcp Per  CC: Wrist pain follow-up  [id number]  Misty Flores is a 37 y.o. female coming in with complaint of R wrist pain. Patient states she still has pain in wrist, but has been taking "cast " off and doing ROM exercises    Patient did have an MRI of the wrist showing the patient did have a first dorsal extensor tenosynovitis as well as moderate radiocarpal arthritic changes with effusion noted in the area.    No past medical history on file. No past surgical history on file. Social History   Socioeconomic History   Marital status: Single    Spouse name: Not on file   Number of children: Not on file   Years of education: Not on file   Highest education level: Not on file  Occupational History   Not on file  Tobacco Use   Smoking status: Never   Smokeless tobacco: Never  Substance and Sexual Activity   Alcohol use: Not on file   Drug use: Not on file   Sexual activity: Not on file  Other Topics Concern   Not on file  Social History Narrative   Not on file   Social Determinants of Health   Financial Resource Strain: Not on file  Food Insecurity: Not on file  Transportation Needs: Not on file  Physical Activity: Not on file  Stress: Not on file  Social Connections: Not on file   Not on File No family history on file.   Current Outpatient Medications (Cardiovascular):    nitroGLYCERIN (NITRODUR - DOSED IN MG/24 HR) 0.2 mg/hr patch, 1/4 patch daily   Current Outpatient Medications (Analgesics):    meloxicam (MOBIC) 15 MG tablet, TAKE 1 TABLET(15 MG) BY MOUTH DAILY   Current Outpatient Medications (Other):    Vitamin D, Ergocalciferol, (DRISDOL) 1.25 MG (50000 UNIT) CAPS capsule, Take 1 capsule (50,000 Units total)  by mouth every 7 (seven) days.   Reviewed prior external information including notes and imaging from  primary care provider As well as notes that were available from care everywhere and other healthcare systems.  Past medical history, social, surgical and family history all reviewed in electronic medical record.  No pertanent information unless stated regarding to the chief complaint.   Review of Systems:  No headache, visual changes, nausea, vomiting, diarrhea, constipation, dizziness, abdominal pain, skin rash, fevers, chills, night sweats, weight loss, swollen lymph nodes, body aches, joint swelling, chest pain, shortness of breath, mood changes. POSITIVE muscle aches  Objective  Blood pressure 110/82, pulse 69, height 5\' 7"  (1.702 m), weight 165 lb (74.8 kg), SpO2 98 %.   General: No apparent distress alert and oriented x3 mood and affect normal, dressed appropriately.  HEENT: Pupils equal, extraocular movements intact  Respiratory: Patient's speak in full sentences and does not appear short of breath  Cardiovascular: No lower extremity edema, non tender, no erythema  Right wrist does have some tenderness to palpation more still in the anatomical snuffbox.  Does have some on the distal radial area.  Still negative Lourena Simmonds test noted though.  Procedure: Real-time Ultrasound Guided Injection of distal radiocarpal joint Device: GE Logiq Q7 Ultrasound guided injection is preferred based studies that show increased duration, increased effect, greater accuracy, decreased procedural  pain, increased response rate, and decreased cost with ultrasound guided versus blind injection.  Verbal informed consent obtained.  Time-out conducted.  Noted no overlying erythema, induration, or other signs of local infection.  Skin prepped in a sterile fashion.  Local anesthesia: Topical Ethyl chloride.  With sterile technique and under real time ultrasound guidance: With a 25-gauge half inch needle  injected with 0.5 cc of 0.5% Marcaine and 0.5 cc of Kenalog 40 mg/mL Completed without difficulty  Pain immediately resolved suggesting accurate placement of the medication.  Advised to call if fevers/chills, erythema, induration, drainage, or persistent bleeding.  Impression: Technically successful ultrasound guided injection.    Impression and Recommendations:     The above documentation has been reviewed and is accurate and complete Judi Saa, DO

## 2023-07-10 ENCOUNTER — Encounter: Payer: Self-pay | Admitting: Family Medicine

## 2023-07-10 ENCOUNTER — Ambulatory Visit: Payer: BC Managed Care – PPO | Admitting: Family Medicine

## 2023-07-10 ENCOUNTER — Other Ambulatory Visit: Payer: Self-pay

## 2023-07-10 VITALS — BP 102/78 | HR 75 | Ht 67.0 in | Wt 166.0 lb

## 2023-07-10 DIAGNOSIS — S56512A Strain of other extensor muscle, fascia and tendon at forearm level, left arm, initial encounter: Secondary | ICD-10-CM | POA: Diagnosis not present

## 2023-07-10 DIAGNOSIS — M25522 Pain in left elbow: Secondary | ICD-10-CM

## 2023-07-10 MED ORDER — MELOXICAM 15 MG PO TABS: 15.0000 mg | ORAL_TABLET | Freq: Every day | ORAL | 0 refills | Status: AC

## 2023-07-10 NOTE — Progress Notes (Signed)
[facility] [address] [place] [address] Phone: [phone] Subjective:   [name], am serving as a scribe for Dr. Antoine Primas.  I'm seeing this patient by the request  of:  Patient, No Pcp Per  CC: Left elbow pain   [id number]  Misty Flores is a 37 y.o. female coming in with complaint of L elbow injury. Patient states that she has pain over the lateral epicondyle. No new activities that are repetitive. Tried tennis elbow strap. Pain will radiate up her arm. Pailful to full extend elbow and extend wrist.      No past medical history on file. No past surgical history on file. Social History   Socioeconomic History   Marital status: Single    Spouse name: Not on file   Number of children: Not on file   Years of education: Not on file   Highest education level: Not on file  Occupational History   Not on file  Tobacco Use   Smoking status: Never   Smokeless tobacco: Never  Substance and Sexual Activity   Alcohol use: Not on file   Drug use: Not on file   Sexual activity: Not on file  Other Topics Concern   Not on file  Social History Narrative   Not on file   Social Determinants of Health   Financial Resource Strain: Not on file  Food Insecurity: Not on file  Transportation Needs: Not on file  Physical Activity: Not on file  Stress: Not on file  Social Connections: Not on file   Not on File No family history on file.   Current Outpatient Medications (Cardiovascular):    nitroGLYCERIN (NITRODUR - DOSED IN MG/24 HR) 0.2 mg/hr patch, 1/4 patch daily   Current Outpatient Medications (Analgesics):    meloxicam (MOBIC) 15 MG tablet, Take 1 tablet (15 mg total) by mouth daily.   Current Outpatient Medications (Other):    Vitamin D, Ergocalciferol, (DRISDOL) 1.25 MG (50000 UNIT) CAPS capsule, Take 1 capsule (50,000 Units total) by mouth every 7 (seven) days.   Reviewed prior external information including  notes and imaging from  primary care provider As well as notes that were available from care everywhere and other healthcare systems.  Past medical history, social, surgical and family history all reviewed in electronic medical record.  No pertanent information unless stated regarding to the chief complaint.   Review of Systems:  No headache, visual changes, nausea, vomiting, diarrhea, constipation, dizziness, abdominal pain, skin rash, fevers, chills, night sweats, weight loss, swollen lymph nodes, body aches, joint swelling, chest pain, shortness of breath, mood changes. POSITIVE muscle aches  Objective  Blood pressure 102/78, pulse 75, height 5\' 7"  (1.702 m), weight 166 lb (75.3 kg), SpO2 98%.   General: No apparent distress alert and oriented x3 mood and affect normal, dressed appropriately.  HEENT: Pupils equal, extraocular movements intact  Respiratory: Patient's speak in full sentences and does not appear short of breath  Cardiovascular: No lower extremity edema, non tender, no erythema  Left elbow tender to palpation in the area.  Seems to be severe overall.  Seems to have worsening pain with resisted extension.   Limited muscular skeletal ultrasound was performed and interpreted by Antoine Primas, M   Limited ultrasound shows patient does have hypoechoic changes noted at the origin of the common extensor tendon on the lateral epicondylar area.  Mild defect noted.  Increasing in neovascularization in Doppler flow noted in this area.  No cortical irregularity noted   Impression and Recommendations:     The above documentation has been reviewed and is accurate and complete Judi Saa, DO

## 2023-07-10 NOTE — Assessment & Plan Note (Addendum)
Elbow anatomy was reviewed, and tendinopathy was explained.  Pt. given a home rehab program. Start with isometrics and ROM, then a series of concentric and eccentric exercises should be done starting with no weight, work up to 1 lb, hammer, etc.  Use counterforce strap if working or using hands.  Formal PT would be beneficial. Emphasized stretching an cross-friction massage Emphasized proper palms up lifting biomechanics to unload ECRB Worsening pain will consider injection.  Failed patient with anti-inflammatory meloxicam that will help.  Follow-up again in 4 weeks

## 2023-07-10 NOTE — Patient Instructions (Signed)
Wrist brace Exercises Meloxicam 15mg  for 10 days then as needed See me again in 4-5 weeks

## 2023-08-08 NOTE — Progress Notes (Unsigned)
Misty Flores [address] [place] [address] Phone: [phone] Subjective:   Misty Flores, am serving as a scribe for Dr. Antoine Primas.  I'm seeing this patient by the request  of:  Patient, No Pcp Per  CC: Left elbow pain follow-up  [id number]  07/10/2023 Elbow anatomy was reviewed, and tendinopathy was explained.   Pt. given a home rehab program. Start with isometrics and ROM, then a series of concentric and eccentric exercises should be done starting with no weight, work up to 1 lb, hammer, etc.   Use counterforce strap if working or using hands.   Formal PT would be beneficial. Emphasized stretching an cross-friction massage Emphasized proper palms up lifting biomechanics to unload ECRB Worsening pain will consider injection.  Failed patient with anti-inflammatory meloxicam that will help.  Follow-up again in 4 weeks  Updated 08/09/2023 Misty Flores is a 37 y.o. female coming in with complaint of elbow pain, found to have a partial tear of the common extensor tendon of the left elbow.  Patient states if wearing the brace the pain is manageable but once it is off its back to being where it was. Meloxicam is not helping        No past medical history on file. No past surgical history on file. Social History   Socioeconomic History   Marital status: Single    Spouse name: Not on file   Number of children: Not on file   Years of education: Not on file   Highest education level: Not on file  Occupational History   Not on file  Tobacco Use   Smoking status: Never   Smokeless tobacco: Never  Substance and Sexual Activity   Alcohol use: Not on file   Drug use: Not on file   Sexual activity: Not on file  Other Topics Concern   Not on file  Social History Narrative   Not on file   Social Determinants of Health   Financial Resource Strain: Not on file  Food Insecurity: Not on file  Transportation Needs: Not on  file  Physical Activity: Not on file  Stress: Not on file  Social Connections: Not on file   Not on File No family history on file.   Current Outpatient Medications (Cardiovascular):    nitroGLYCERIN (NITRODUR - DOSED IN MG/24 HR) 0.2 mg/hr patch, 1/4 patch daily   Current Outpatient Medications (Analgesics):    meloxicam (MOBIC) 15 MG tablet, Take 1 tablet (15 mg total) by mouth daily.   Current Outpatient Medications (Other):    Vitamin D, Ergocalciferol, (DRISDOL) 1.25 MG (50000 UNIT) CAPS capsule, Take 1 capsule (50,000 Units total) by mouth every 7 (seven) days.   Reviewed prior external information including notes and imaging from  primary care provider As well as notes that were available from care everywhere and other healthcare systems.  Past medical history, social, surgical and family history all reviewed in electronic medical record.  No pertanent information unless stated regarding to the chief complaint.   Review of Systems:  No headache, visual changes, nausea, vomiting, diarrhea, constipation, dizziness, abdominal pain, skin rash, fevers, chills, night sweats, weight loss, swollen lymph nodes, body aches, joint swelling, chest pain, shortness of breath, mood changes. POSITIVE muscle aches  Objective  Blood pressure 90/60, pulse 73, height 5\' 7"  (1.702 m), SpO2 98%.   General: No apparent distress alert and oriented x3 mood and affect normal, dressed appropriately.  HEENT: Pupils equal, extraocular movements  intact  Respiratory: Patient's speak in full sentences and does not appear short of breath  Cardiovascular: No lower extremity edema, non tender, no erythema  Left elbow exam shows ttp on lateral aspect of the left elbow weakness noted patient does have weakness noted with the extension of the wrist.  Limited muscular skeletal ultrasound was performed and interpreted by Antoine Primas, M  Limited ultrasound does have hypoechoic changes of the fibers of the  common extensor tendon.  Seems to be greater than 50% of the tendon noted.  Mild retraction of 0.5 cm noted. Impression: Common extensor tendon tear noted.     Impression and Recommendations:    The above documentation has been reviewed and is accurate and complete Judi Saa, DO

## 2023-08-09 ENCOUNTER — Encounter: Payer: Self-pay | Admitting: Family Medicine

## 2023-08-09 ENCOUNTER — Other Ambulatory Visit: Payer: Self-pay

## 2023-08-09 ENCOUNTER — Ambulatory Visit: Payer: BC Managed Care – PPO | Admitting: Family Medicine

## 2023-08-09 VITALS — BP 90/60 | HR 73 | Ht 67.0 in

## 2023-08-09 DIAGNOSIS — S56512A Strain of other extensor muscle, fascia and tendon at forearm level, left arm, initial encounter: Secondary | ICD-10-CM

## 2023-08-09 DIAGNOSIS — M25522 Pain in left elbow: Secondary | ICD-10-CM

## 2023-08-09 NOTE — Assessment & Plan Note (Signed)
Worsening pain really, only comfortable in the brace. Failed PT  On ultrasound patient does have what appears to be a high-grade tear noted.  More than 50% of the common extensor tendon appears to be involved.  We did discuss different treatment options but patient would like to like to talk to surgery.  Has done very well with surgery in other areas including her shoulder.  Will refer patient to orthopedic surgery to discuss further

## 2023-08-09 NOTE — Patient Instructions (Addendum)
Good to see you  Referral to Dr. Ave Filter placed Read about the PRP Follow up in 6 weeks

## 2024-03-26 IMAGING — MR MR KNEE*L* W/O CM
4 of 7 series · 19 of 40 positions shown · non-contrast
Comparison: Left knee radiographs 01/17/2022

CLINICAL DATA: One month of left knee pain and swelling. Evaluate
for meniscal injury.

EXAM:
MRI OF THE LEFT KNEE WITHOUT CONTRAST
TECHNIQUE: Multiplanar, multisequence MR imaging of the knee was performed. No
intravenous contrast was administered.

[Series 3: T2 fat-sat · axial · 4.0mm · 0.50mm/px · z∈[-61,+34]mm · 3 of 24 slices shown]
[im 5/24]
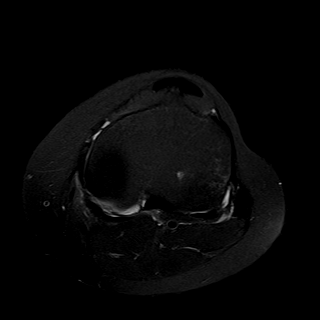
[im 14/24]
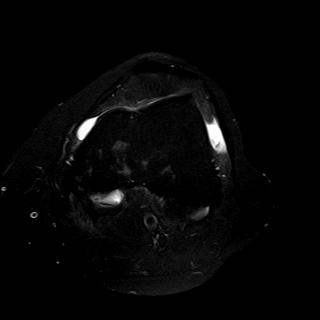
[im 24/24]
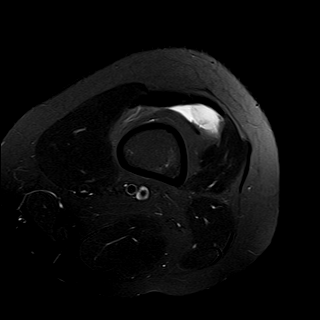

[Series 6: PD fat-sat · coronal · 3.0mm · 0.29mm/px · 7 of 28 slices shown (1 of 3)]
[im 1/28]
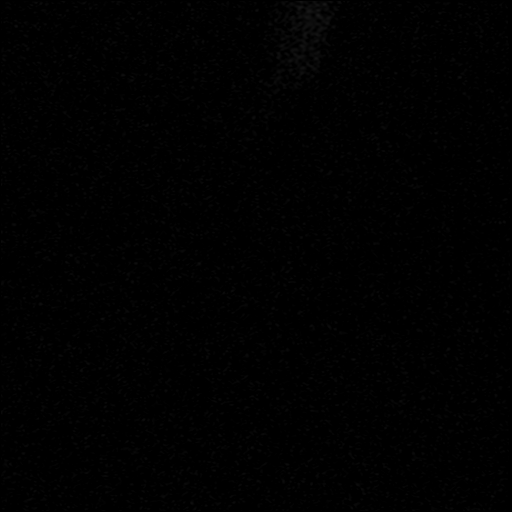
[im 5/28]
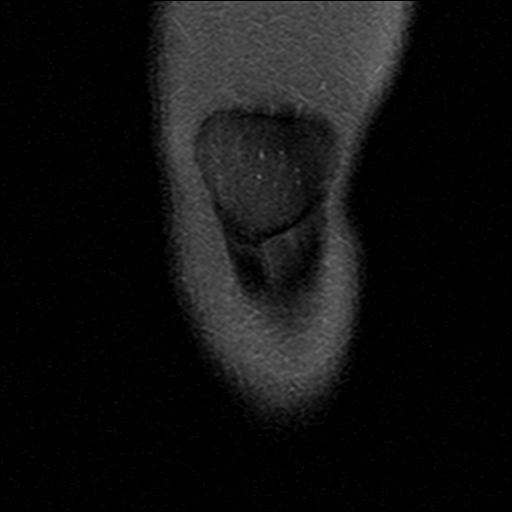
[im 10/28]
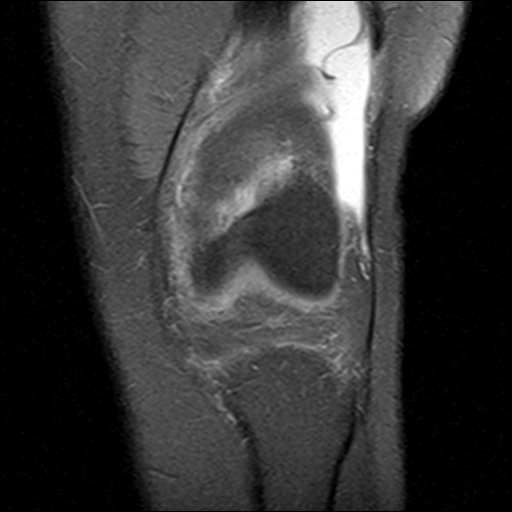
[im 14/28]
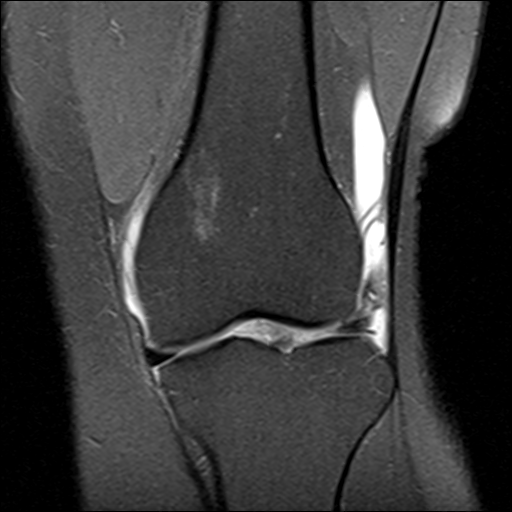
[im 19/28]
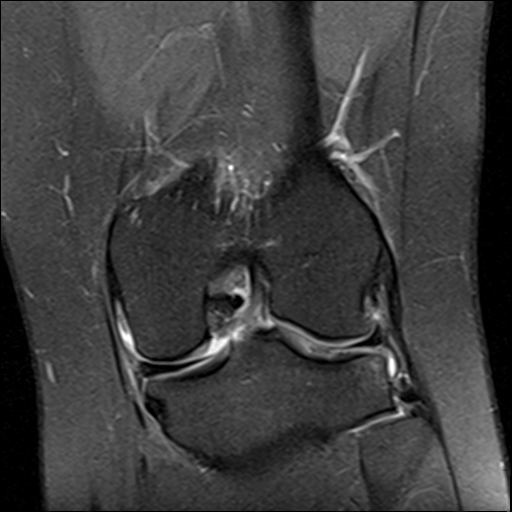
[im 23/28]
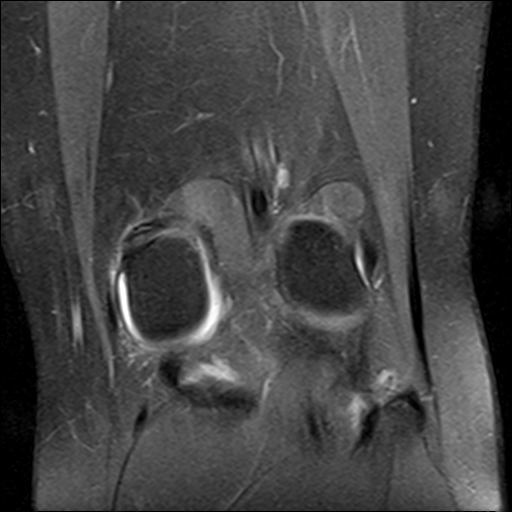
[im 28/28]
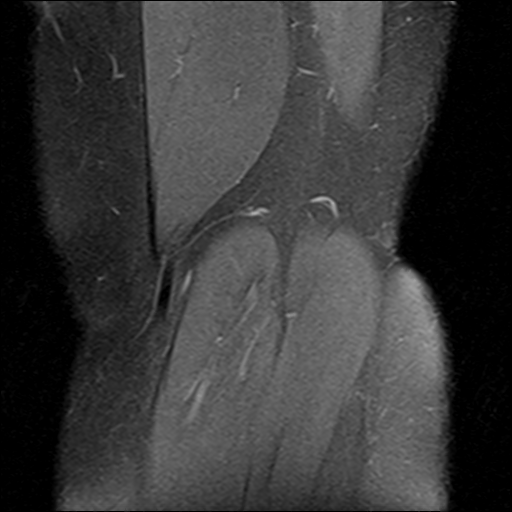

[Series 8: PD fat-sat · sagittal · 3.0mm · 0.29mm/px · 6 of 27 slices shown (2 of 3)]
[im 1/27]
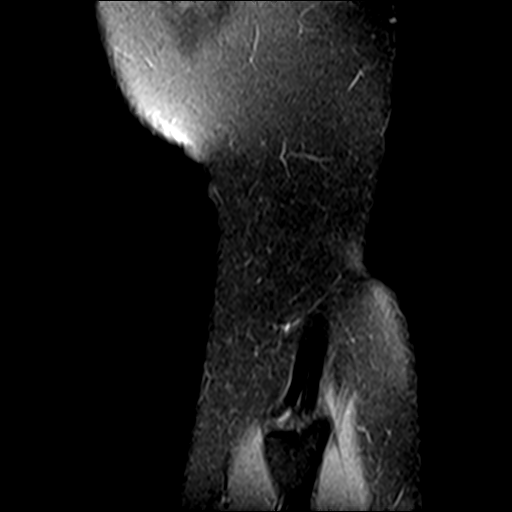
[im 5/27]
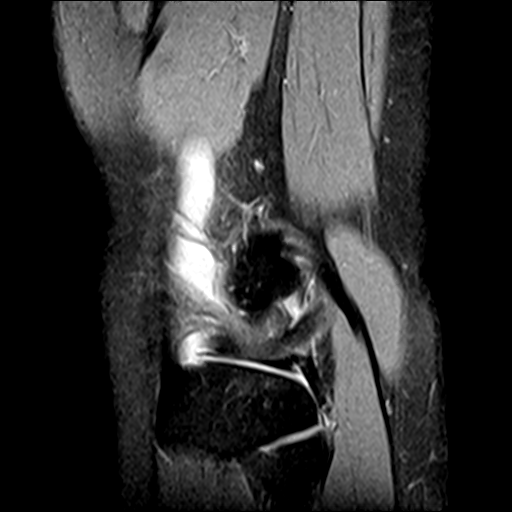
[im 9/27]
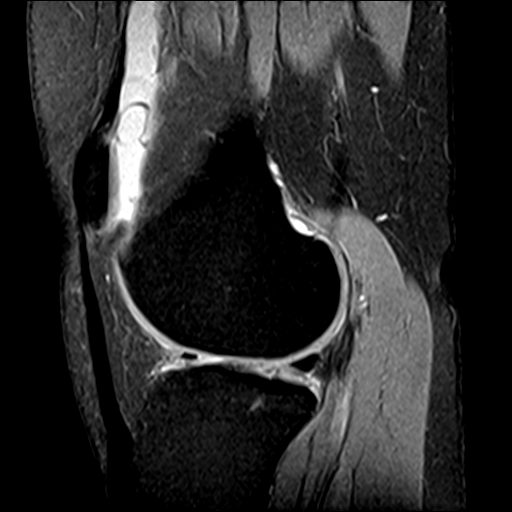
[im 14/27]
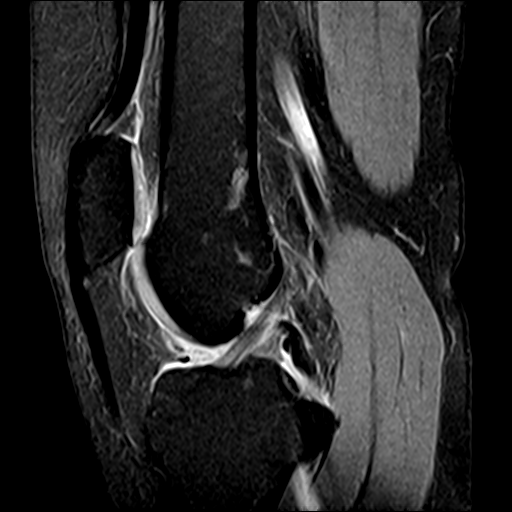
[im 18/27]
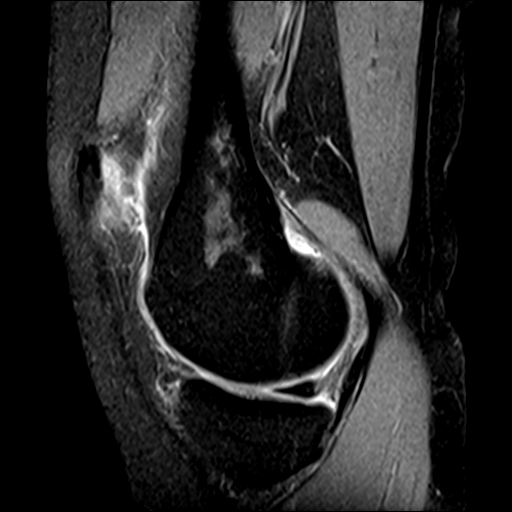
[im 22/27]
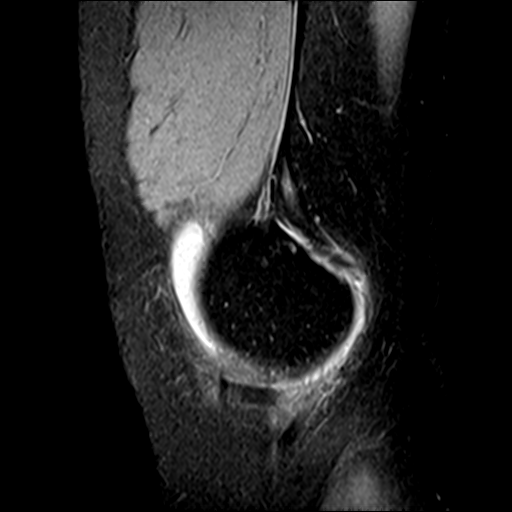

[Series 9: PD fat-sat · oblique · 2.0mm · 0.29mm/px · 3 of 11 slices shown (3 of 3)]
[im 1/11]
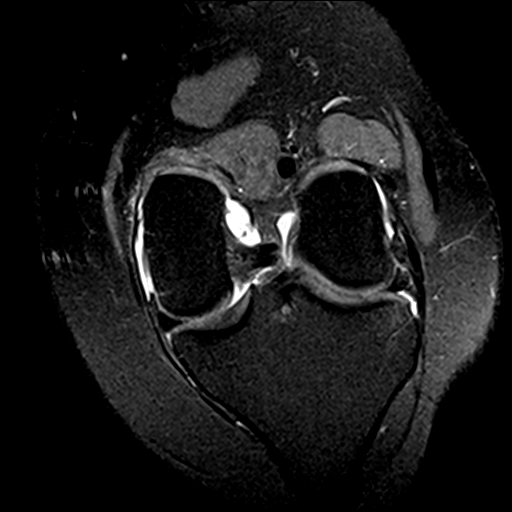
[im 6/11]
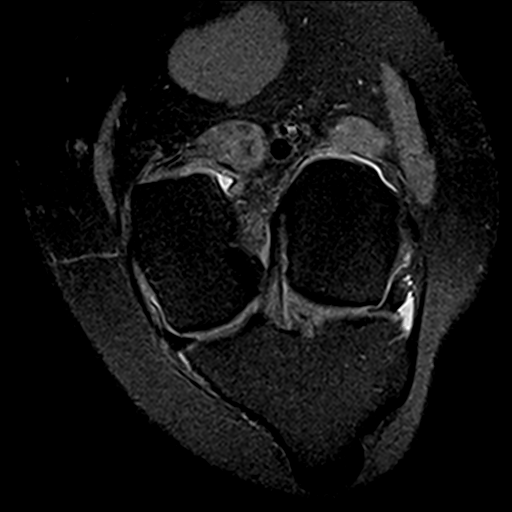
[im 11/11]
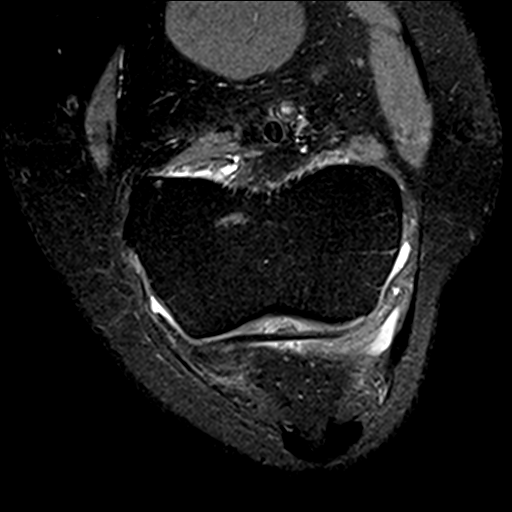

[19 of 40 positions shown; findings below may reference images not displayed]

FINDINGS: MENISCI

Medial meniscus: There is increased proton density signal at the
junction of the anterior transverse meniscal ligament on the
anterior horn of the medial meniscus (sagittal series 8 images
17-19), likely intrasubstance degeneration. No definitive tear is
seen extending through an articular surface of the medial meniscus.

Lateral meniscus: There is moderate to high-grade attenuation of the
anterior horn of the lateral meniscus especially at the root
(sagittal series 8 images 7 through 11), a tear involving the
superior greater than inferior articular surfaces of the meniscal
triangle. Moderate extrusion of the anterior segment of the body of
the lateral meniscus.

LIGAMENTS

Cruciates: The ACL and PCL are intact.

Collaterals: The medial collateral ligament is intact. The fibular
collateral ligament, biceps femoris tendon, iliotibial band, and
popliteus tendon are intact.

CARTILAGE

Patellofemoral: There appears to be a small high-grade partial to
full-thickness defect within the far superomedial aspect of the
medial patellar facet cartilage (axial series 3, images 5-7). The
medial and lateral trochlear cartilage appears intact.

Medial: Mild-to-moderate thinning of the medial aspect of the
weight-bearing medial femoral condyle and medial tibial plateau
cartilage.

Lateral: Mild thinning of the lateral aspect of the lateral tibial
plateau cartilage with mild subchondral marrow edema. Also moderate
thinning of the far lateral aspect of the weight-bearing lateral
femoral condyle cartilage.

Joint: Mild-to-moderatejoint effusion. Normal Hoffa's fat pad. No
plical thickening.

Popliteal Fossa:  No Baker's cyst.

Extensor Mechanism: Intact quadriceps tendon and patellar tendon. As
seen on prior radiographs, there is minimal spurring at the inferior
pole of the patella and the superior aspect of the tibial tubercle
likely chronic traction changes at the origin and insertion of the
patellar tendon.

Bones:  No acute fracture or dislocation.

Other: None.
IMPRESSION: :
IMPRESSION: 1. There is moderate to high-grade attenuation of the anterior horn
of the lateral meniscus, a complex tear involving the superior
greater than inferior articular surfaces of the meniscal triangle.
2. There is increased signal at the insertion of the transverse
ligament into the anterior horn of the medial meniscus likely
intrasubstance degeneration. No definite tear is seen extending
through an articular surface of the medial meniscus.
3. Superomedial aspect of the medial patellar facet focal cartilage
defect. Mild lateral greater than medial compartment cartilage
degenerative changes.
4. Mild-to-moderate joint effusion.
